# Patient Record
Sex: Male | Born: 1995 | Hispanic: No | Marital: Single | Smoking: Never smoker
Health system: Southern US, Community
[De-identification: ages and names within clinical notes are randomized; demographics above are authoritative.]

## PROBLEM LIST (undated history)

## (undated) DIAGNOSIS — J45909 Unspecified asthma, uncomplicated: Secondary | ICD-10-CM

## (undated) DIAGNOSIS — K37 Unspecified appendicitis: Secondary | ICD-10-CM

## (undated) HISTORY — DX: Unspecified asthma, uncomplicated: J45.909

## (undated) HISTORY — DX: Unspecified appendicitis: K37

---
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
## 2016-03-17 DIAGNOSIS — K37 Unspecified appendicitis: Secondary | ICD-10-CM

## 2016-03-17 HISTORY — DX: Unspecified appendicitis: K37

## 2016-03-29 HISTORY — PX: APPENDECTOMY: SHX54

## 2016-04-06 ENCOUNTER — Encounter: Payer: Self-pay | Admitting: *Deleted

## 2016-04-09 ENCOUNTER — Ambulatory Visit (INDEPENDENT_AMBULATORY_CARE_PROVIDER_SITE_OTHER): Payer: BLUE CROSS/BLUE SHIELD | Admitting: General Surgery

## 2016-04-09 ENCOUNTER — Encounter: Payer: Self-pay | Admitting: General Surgery

## 2016-04-09 VITALS — BP 124/76 | HR 62 | Resp 12 | Ht 74.0 in | Wt 202.0 lb

## 2016-04-09 DIAGNOSIS — K3589 Other acute appendicitis without perforation or gangrene: Secondary | ICD-10-CM

## 2016-04-09 DIAGNOSIS — J45909 Unspecified asthma, uncomplicated: Secondary | ICD-10-CM | POA: Insufficient documentation

## 2016-04-09 NOTE — Patient Instructions (Addendum)
Call the office if experience fever or abdominal pain. Follow up in one month. Resume activities in 6 weeks.

## 2016-04-09 NOTE — Progress Notes (Signed)
Patient ID: Christian Contreras, male   DOB: Jan 20, 1996, 20 y.o.   MRN: [id number]  Chief Complaint  Patient presents with  . Other    Post appendectomy     HPI Christian Contreras is a 20 y.o. male here today for an evalution 11 days post op for an appendectomy done on 03/29/16 while vacationing in [place]. Patient states while in [place] he experienced abdominal pain for 3 days and went to the hospital, they did an ultrasound which showed an abscess in the appendix. He is doing well, he states the staples are still present.  I have reviewed the history of present illness with the patient.  HPI  Past Medical History:  Diagnosis Date  . Appendicitis 03/2016   Appendectomy in [place]  . Asthma     Past Surgical History:  Procedure Laterality Date  . APPENDECTOMY  03/29/2016    Family History  Problem Relation Age of Onset  . Cancer Paternal Grandfather     prostate    Social History Social History  Substance Use Topics  . Smoking status: Never Smoker  . Smokeless tobacco: Never Used  . Alcohol use 0.6 oz/week    1 Glasses of wine per week    Allergies  Allergen Reactions  . Amoxicillin Dermatitis  . Penicillins   . Vancomycin     Current Outpatient Prescriptions  Medication Sig Dispense Refill  . ADDERALL XR 25 MG 24 hr capsule TK 1 C PO QD  0   No current facility-administered medications for this visit.     Review of Systems Review of Systems  Constitutional: Positive for activity change (reduced activity secondary to operation). Negative for fever.  Respiratory: Negative.   Cardiovascular: Negative.   Gastrointestinal: Negative for abdominal pain.    Blood pressure 124/76, pulse 62, resp. rate 12, height 6\' 2"  (1.88 m), weight 202 lb (91.6 kg).  Physical Exam Physical Exam  Constitutional: He is oriented to person, place, and time. He appears well-developed and well-nourished. No distress.  Eyes: Conjunctivae are normal. No scleral icterus.  Neck:  Neck supple.  Cardiovascular: Normal rate, regular rhythm and normal heart sounds.   Pulmonary/Chest: Effort normal and breath sounds normal.  Abdominal: Soft. Normal appearance and bowel sounds are normal. There is no tenderness.    Well healed scar  Lymphadenopathy:    He has no cervical adenopathy.  Neurological: He is alert and oriented to person, place, and time.  Skin: Skin is warm and dry.    Data Reviewed Notes and operative report reviewed. Notably, medical and operative reports from the incident are all in [place] and ultrasound photos are not clear enough for appropriate evaluation.  Assessment   11 days status post appendectomy. Incision is clean and intact.     Plan   May swim, light activty is acceptable, no strenuous excercise  Resume normal activities as in 6 weeks. Return to the office for final follow up and activity clearance in one month.   PCP: Nettie Elm, MD       Gerlene Burdock G 04/09/2016, 4:07 PM

## 2016-05-02 ENCOUNTER — Ambulatory Visit: Payer: BLUE CROSS/BLUE SHIELD | Admitting: General Surgery

## 2016-06-05 ENCOUNTER — Encounter: Payer: Self-pay | Admitting: *Deleted
